# Patient Record
Sex: Male | Born: 1990 | Race: Black or African American | Hispanic: No | Marital: Single | Smoking: Never smoker
Health system: Southern US, Community
[De-identification: ages and names within clinical notes are randomized; demographics above are authoritative.]

## PROBLEM LIST (undated history)

## (undated) HISTORY — PX: WRIST FUSION: SHX839

## (undated) HISTORY — PX: FEMUR SURGERY: SHX943

---
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
## 2009-04-12 ENCOUNTER — Emergency Department (HOSPITAL_BASED_OUTPATIENT_CLINIC_OR_DEPARTMENT_OTHER): Admission: EM | Admit: 2009-04-12 | Discharge: 2009-04-12 | Payer: Self-pay | Admitting: Emergency Medicine

## 2009-04-12 ENCOUNTER — Ambulatory Visit: Payer: Self-pay | Admitting: Diagnostic Radiology

## 2011-02-09 LAB — RAPID STREP SCREEN (MED CTR MEBANE ONLY): Streptococcus, Group A Screen (Direct): NEGATIVE

## 2011-02-09 LAB — URINALYSIS, ROUTINE W REFLEX MICROSCOPIC
Glucose, UA: NEGATIVE mg/dL
Leukocytes, UA: NEGATIVE
Nitrite: NEGATIVE
Protein, ur: 30 mg/dL — AB
Urobilinogen, UA: 1 mg/dL (ref 0.0–1.0)

## 2011-02-09 LAB — URINE MICROSCOPIC-ADD ON

## 2014-07-10 ENCOUNTER — Emergency Department (HOSPITAL_BASED_OUTPATIENT_CLINIC_OR_DEPARTMENT_OTHER)
Admission: EM | Admit: 2014-07-10 | Discharge: 2014-07-10 | Disposition: A | Discharge status: Home / Self Care | Payer: 59 | Attending: Emergency Medicine | Admitting: Emergency Medicine

## 2014-07-10 ENCOUNTER — Encounter (HOSPITAL_BASED_OUTPATIENT_CLINIC_OR_DEPARTMENT_OTHER): Payer: Self-pay | Admitting: Emergency Medicine

## 2014-07-10 DIAGNOSIS — R369 Urethral discharge, unspecified: Secondary | ICD-10-CM | POA: Diagnosis not present

## 2014-07-10 DIAGNOSIS — Z202 Contact with and (suspected) exposure to infections with a predominantly sexual mode of transmission: Secondary | ICD-10-CM | POA: Diagnosis not present

## 2014-07-10 LAB — RPR

## 2014-07-10 LAB — HIV ANTIBODY (ROUTINE TESTING W REFLEX): HIV 1&2 Ab, 4th Generation: NONREACTIVE

## 2014-07-10 MED ORDER — AZITHROMYCIN 250 MG PO TABS
1000.0000 mg | ORAL_TABLET | Freq: Once | ORAL | Status: AC
  Administered 2014-07-10: 1000 mg via ORAL
  Filled 2014-07-10: qty 4

## 2014-07-10 MED ORDER — CEFTRIAXONE SODIUM 250 MG IJ SOLR
250.0000 mg | Freq: Once | INTRAMUSCULAR | Status: AC
  Administered 2014-07-10: 250 mg via INTRAMUSCULAR
  Filled 2014-07-10: qty 250

## 2014-07-10 NOTE — Discharge Instructions (Signed)
Chlamydia Chlamydia is an infection. It is spread through sexual contact. Chlamydia can be in different areas of the body. These areas include the urethra, throat, or rectum. It is important to treat chlamydia as soon as possible. It can damage other organs.  CAUSES  Chlamydia is caused by bacteria. It is a sexually transmitted disease. This means that it is passed from an infected partner during intimate contact. This contact could be with the genitals, mouth, or rectal area.  SIGNS AND SYMPTOMS  There may not be any symptoms. This is often the case early in the infection. If there are symptoms, they are usually mild and may only be noticeable in the morning. Symptoms you may notice include:   Burning with urination.  Pain or swelling in the testicles.  Watery mucus-like discharge from the penis.  Long-standing (chronic) pelvic pain after frequent infections.  Pain, swelling, or itching around the anus.  A sore throat.  Itching, burning, or redness in the eyes, or discharge from the eyes. DIAGNOSIS  To diagnose this infection, your health care provider will do a pelvic exam. A sample of urine or a swab from the rectum may be taken for testing.  TREATMENT  Chlamydia is treated with antibiotic medicines.  HOME CARE INSTRUCTIONS  Take your antibiotic medicine as directed by your health care provider. Finish the antibiotic even if you start to feel better. Incomplete treatment will put you at risk for not being able to have children (sterility).   Take medicines only as directed by your health care provider.   Rest.   Inform any sexual partners about your infection. Even if they are symptom free or have a negative culture or evaluation, they should be treated for the condition.   Do not have sex (intercourse) until treatment is completed and your health care provider says it is okay.   Keep all follow-up visits as directed by your health care provider.   Not all test results  are available during your visit. If your test results are not back during the visit, make an appointment with your health care provider to find out the results. Do not assume everything is normal if you have not heard from your health care provider or the medical facility. It is your responsibility to get your test results. SEEK MEDICAL CARE IF:  You develop new joint pain.  You have a fever. SEEK IMMEDIATE MEDICAL CARE IF:   Your pain increases.   You have abnormal discharge.   You have pain during intercourse. MAKE SURE YOU:   Understand these instructions.  Will watch your condition.  Will get help right away if you are not doing well or get worse. Document Released: 10/19/2005 Document Revised: 03/05/2014 Document Reviewed: 04/27/2013 [facility] Patient Information 2015 [place], [place]. This information is not intended to replace advice given to you by your health care provider. Make sure you discuss any questions you have with your health care provider. Gonorrhea Gonorrhea is an infection that can cause serious problems. If left untreated, the infection may:   Damage the male or male organs.   Cause women to be unable to have children (sterility).   Harm a fetus if the infected woman is pregnant.  It is important to get treatment for gonorrhea as soon as possible. It is also necessary that all your sexual partners be tested for the infection.  CAUSES  Gonorrhea is caused by bacteria called Neisseria gonorrhoeae. The infection is spread from person to person, usually by sexual  contact (such as by anal, vaginal, or oral means). A newborn can contract the infection from his or her mother during birth.  SYMPTOMS  Some people with gonorrhea do not have symptoms. Symptoms may be different in females and males.  Females The most common symptoms are:   Pain in the lower abdomen.   Fever with or without chills.  Other symptoms include:   Abnormal vaginal discharge.    Painful intercourse.   Burning or itching of the vagina or lips of the vagina.   Abnormal vaginal bleeding.   Pain when urinating.   Long-lasting (chronic) pain in the lower abdomen, especially during menstruation or intercourse.   Inability to become pregnant.   Going into premature labor.   Irritation, pain, bleeding, or discharge from the rectum. This may occur if the infection was spread by anal sex.   Sore throat or swollen lymph nodes in the neck. This may occur if the infection was spread by oral sex.  Males The most common symptoms are:   Discharge from the penis.   Pain or burning during urination.   Pain or swelling in the testicles. Other symptoms may include:   Irritation, pain, bleeding, or discharge from the rectum. This may occur if the infection was spread by anal sex.   Sore throat, fever, or swollen lymph nodes in the neck. This may occur if the infection was spread by oral sex.  DIAGNOSIS  A diagnosis is made after a physical exam is done and a sample of discharge is examined under a microscope for the presence of the bacteria. The discharge may be taken from the urethra, cervix, throat, or rectum.  TREATMENT  Gonorrhea is treated with antibiotic medicines. It is important for treatment to begin as soon as possible. Early treatment may prevent some problems from developing.  HOME CARE INSTRUCTIONS   Take medicines only as directed by your health care provider.   Take your antibiotic medicine as directed by your health care provider. Finish the antibiotic even if you start to feel better. Incomplete treatment will put you at risk for continued infection.   Do not have sex until treatment is complete or as directed by your health care provider.   Keep all follow-up visits as directed by your health care provider.   Not all test results are available during your visit. If your test results are not back during the visit, make an  appointment with your health care provider to find out the results. Do not assume everything is normal if you have not heard from your health care provider or the medical facility. It is your responsibility to get your test results.  If you test positive for gonorrhea, inform your recent sexual partners. They need to be checked for gonorrhea even if they do not have symptoms. They may need treatment, even if they test negative for gonorrhea.  SEEK MEDICAL CARE IF:   You develop any bad reaction to the medicine you were prescribed. This may include:   A rash.   Nausea.   Vomiting.   Diarrhea.   Your symptoms do not improve after a few days of taking antibiotics.   Your symptoms get worse.   You develop increased pain, such as in the testicles (for males) or in the abdomen (for females).  You have a fever. MAKE SURE YOU:   Understand these instructions.  Will watch your condition.  Will get help right away if you are not doing well or get worse.  Document Released: 10/16/2000 Document Revised: 03/05/2014 Document Reviewed: 04/26/2013 [facility] Patient Information 2015 [place], [place]. This information is not intended to replace advice given to you by your health care provider. Make sure you discuss any questions you have with your health care provider.   Emergency Department Resource Guide 1) Find a Doctor and Pay Out of Pocket Although you won't have to find out who is covered by your insurance plan, it is a good idea to ask around and get recommendations. You will then need to call the office and see if the doctor you have chosen will accept you as a new patient and what types of options they offer for patients who are self-pay. Some doctors offer discounts or will set up payment plans for their patients who do not have insurance, but you will need to ask so you aren't surprised when you get to your appointment.  2) Contact Your Local Health Department Not all health  departments have doctors that can see patients for sick visits, but many do, so it is worth a call to see if yours does. If you don't know where your local health department is, you can check in your phone book. The CDC also has a tool to help you locate your state's health department, and many state websites also have listings of all of their local health departments.  3) Find a Walk-in Clinic If your illness is not likely to be very severe or complicated, you may want to try a walk in clinic. These are popping up all over the country in pharmacies, drugstores, and shopping centers. They're usually staffed by nurse practitioners or physician assistants that have been trained to treat common illnesses and complaints. They're usually fairly quick and inexpensive. However, if you have serious medical issues or chronic medical problems, these are probably not your best option.  No Primary Care Doctor: - Call Health Connect at  [phone] - they can help you locate a primary care doctor that  accepts your insurance, provides certain services, etc. - Physician Referral Service- [phone]  Chronic Pain Problems: Organization         Address  Phone   Notes  [facility]  [phone] Patients need to be referred by their primary care doctor.   Medication Assistance: Organization         Address  Phone   Notes  [facility] Medication [facility] [address] [place] [place], [place] [address] [phone] --Must be a resident of [facility] -- Must have NO insurance coverage whatsoever (no Medicaid/ Medicare, etc.) -- The pt. MUST have a primary care doctor that directs their care regularly and follows them in the community   [facility]ist  [phone]   [employer]  [phone]    Agencies that provide inexpensive medical care: Organization         Address  Phone   Notes  [facility]  [phone]   [facility]     [phone]   [facility] [address] [place], [place] [address] [phone]   [facility] [address] [place] [place] [phone]   Planned Parenthood    [phone]   [facility]    [phone]   [facility] and [facility]  [address], [place] Phone:  [phone], Fax:  [phone] Hours of Operation:  9 am - 6 pm, M-F.  Also accepts Medicaid/Medicare and self-pay.  [facility]  [address], [place] Phone: [phone]) [phone], Fax: [phone](336) [phone]. Hours of Operation:  8:30 am - 5:30 pm, M-F.  Also accepts Medicaid and self-pay.  [facility] [place] [address], [place] Phone: [phone](336) [phone]   [facility] [address], [place], [place] [phone], Ext. [phone] Mondays & Thursdays: 7-9 AM.  First 15 patients are seen on a first come, first serve basis.    Medicaid-accepting [facility] Providers:  Organization         Address  Phone   Notes  [facility] [address], [place] [phone]) [phone] Also accepts self-pay patients.  [facility] [address] [place], [place]  [phone](336) [phone]   [facility] [address], [place] [phone]) [phone]   [facility] [address] [place] [address], [place] [phone]) [phone]   Renaye RakersVeita Bland [address], [place]   [phone] Only accepts [place] Access [place] patients after they have their name applied to their card.   Self-Pay (no insurance) in [facility] [place] [facility]:  Organization         Address  Phone   Notes  Sickle Cell Patients, [facility] [address] [place] [address], [place] [phone]) [phone]   [facility] [address], [place] [phone]) [phone]   [facility] [place]  [address], [place] [phone]    [facility]/Dr. Osei-Bonsu  [address] [place] [address], [place] or [phone], [place] [phone](336) [phone] Phone number for both [place] and [place] locations is the same.  [facility] [address], [place] [phone](336) [phone]   [facility] [address] [place] [address], [place] or [address] [phone]   [facility] [address], [place] [phone](336) [phone], phone; [phone], fax Sees patients 1st and 3rd Saturday of every month.  Must not qualify for public or private insurance (i.e. Medicaid, Medicare, [place] Health Choice, Veterans' Benefits)  Household income should be no more than 200% of the poverty level The clinic cannot treat you if you are pregnant or think you are pregnant  Sexually transmitted diseases are not treated at the clinic.    Dental Care: Organization         Address  Phone  Notes  [facility] of [facility] [address], [place] [phone] Accepts children up to age 24 who are enrolled in [place] or [place] Health Choice; pregnant women with a Medicaid card; and children who have applied for Medicaid or [place] Health Choice, but were declined, whose parents can pay a reduced fee at time of service.  [facility] Department of [facility] [place]  [address], [place] [phone]) [phone] Accepts children up to age 24 who are enrolled in [place] or [place] Health Choice; pregnant women with a Medicaid card; and children who have applied for Medicaid or [place] Health Choice, but were declined, whose parents can pay a reduced fee at time of service.  [facility]  [address], [place] [phone] Patients are seen by appointment only. Walk-ins are not accepted. [facility] will see patients 24 years of age and older. Monday - Tuesday (8am-5pm)  Most Wednesdays (8:30-5pm) $30 per visit,  cash only  [facility] [employer] PROGRAM  [address], [facility] [phone] Patients are seen by appointment only. Walk-ins are not accepted. [facility] will see patients 67 years of age and older. One Wednesday Evening (Monthly: Volunteer Based).  $30 per visit, cash only  [employer]  [phone] for adults; Children under age 62, call Graduate Pediatric Dentistry at [phone]. Children aged 13-14, please call [phone] to request a pediatric application.  Dental services are provided in all areas of dental care including fillings, crowns and bridges, complete and partial dentures, implants, gum treatment, root canals, and extractions. Preventive care is also provided. Treatment is provided to both adults and children. Patients are selected via a lottery and there is often a waiting list.   [facility] [address] [place]  [phone] [email or web address]   [facility] [address] [place], [place] [phone], Ext. [phone] Second and Fourth Thursday of each month, opens at 6:30 AM; Clinic ends at 9 AM.  Patients are seen on a first-come first-served basis, and a limited number are seen during each clinic.   [facility]  [address] [place], [place] [phone]   Eligibility Requirements You must have lived in [place], [place], or [place] counties for at least the last three months.   You cannot be eligible for state or federal sponsored [place], including [employer], [place], or [employer].   You generally cannot be eligible for healthcare insurance through your employer.    How to apply: Eligibility screenings are held every Tuesday and Wednesday afternoon from 1:00 pm until 4:00 pm. You do not need an appointment for the interview!  [facility] [address] [place], [place] [phone]   [facility] Health Department   [phone]   [facility] Health Department  [phone]   [facility] Health Department  [phone]    Behavioral Health Resources in the Community: Intensive Outpatient Programs Organization         Address  Phone  Notes  [facility] [address] [place], [place] [phone]   [facility] Outpatient [address] [place], [place] [phone]   [facility] [address] [place], [place]  [phone]   [facility] Mental Health [address],  [place], [place] [phone] or [phone]   Substance Abuse Resources Organization         Address  Phone  Notes  [facility] Drug [facility]  870-590-5645   [facility]  [phone]   [facility] [place]  [phone]   Floydene Flock  [phone]   Residential & Outpatient Substance Abuse Program  272-360-0024   Psychological Services Organization         Address  Phone  Notes  [facility]  [phone]   [facility] Services  [phone]   [facility] Mental Health [address] [place] [address], [place] [phone] or [phone]    Mobile Crisis Teams Organization         Address  Phone  Notes  [facility], Mobile Crisis Care Unit  [phone]   [facility]  [address] [place] [address] [place], [place] [phone]   Doristine Locks [address] [place] [phone]    Self-Help/Support Groups Organization         Address  Phone  Notes  [facility] of [place] - variety of support groups  336- I7437963 Call for more information  Narcotics Anonymous (NA), [facility] [address], [employer] [place]  2 meetings at this location   Statistician         Address  Phone  Notes  [facility] [address],    [place]  [phone]   [facility]  [address] [place] [address], [place], [place] [phone]    [facility] [address] [place], [place] [phone] Admissions: 8am-3pm M-F  [facility] [address]    [place], [place] [phone]   [facility] [address] [place], [place], [place] [phone]   [facility] [address],  [place], [place] [phone]   [facility] - Intensive Outpatient [address], [place], [place] [phone]   [facility] ([facility]iction [facility] [address]  [place], [place] [phone] or [phone]   [facility] ([facility]) [address], [place], [place] [phone] Accepts Medicaid  [facility] [place] [address],  [place] [phone] Substance Abuse/Addiction Treatment   [facility] Organization         Address  Phone  Notes  [facility]  269-788-2536   Angie Fava, PhD [address] [place], [place]   [phone]) [phone] or [phone]   [facility] Behavioral   [address] [place], [place] [phone]   [facility] [address] [place], [place] [phone] Insurance/Medicaid/sponsorship through [facility] and Families [address] [place] [address]                                    [place], [place] [phone] Therapy/tele-psych/case  [facility] [address], [place] [phone]    Dr. Lolly Mustache  [phone]   [facility]  [employer] [facility] Dept. 1) [address], [place] 2) [address], [place] 3)  [address], [place] [phone]  [phone]   [facility] Child Abuse Hotline (430)125-7182 or [phone] (After Hours)

## 2014-07-10 NOTE — ED Provider Notes (Signed)
CSN: [id number]     Arrival date & time 07/10/14  1759 History  This chart was scribed for Mirian Mo, MD by Roxy Cedar, ED Scribe. This patient was seen in room MH03/MH03 and the patient's care was started at 6:19 PM.  Chief Complaint  Patient presents with  . Exposure to STD   Patient is a 23 y.o. male presenting with STD exposure. The history is provided by the patient. No language interpreter was used.  Exposure to STD This is a new problem. Pertinent negatives include no chest pain, no abdominal pain, no headaches and no shortness of breath. Nothing aggravates the symptoms. Nothing relieves the symptoms. He has tried nothing for the symptoms.   HPI Comments: Jon Kelley is a 23 y.o. male who presents to the Emergency Department complaining of STD symptoms, due to engaging in unprotected sex last week. Patient states that he was shaking. Patient states he noticed "clear moisture" but no drainage. He states that his girlfriend told him that she had chlamydia. Patient denies associated testicular pain.  History reviewed. No pertinent past medical history. Past Surgical History  Procedure Laterality Date  . Femur surgery    . Wrist fusion     History reviewed. No pertinent family history. History  Substance Use Topics  . Smoking status: Never Smoker   . Smokeless tobacco: Not on file  . Alcohol Use: No    Review of Systems  Constitutional: Negative for fever and chills.  Eyes: Negative for visual disturbance.  Respiratory: Negative for shortness of breath.   Cardiovascular: Negative for chest pain.  Gastrointestinal: Negative for nausea, vomiting and abdominal pain.  Neurological: Negative for headaches.  All other systems reviewed and are negative.  Allergies  Review of patient's allergies indicates no known allergies.  Home Medications   Prior to Admission medications   Not on File   Triage Vitals: BP 121/97  Pulse 77  Temp(Src) 98.2 F (36.8 C)  Resp  16  Ht  (1.753 m)  Wt 175 lb (79.379 kg)  BMI 25.83 kg/m2  SpO2 100%  Physical Exam  Nursing note and vitals reviewed. Constitutional: He appears well-developed and well-nourished.  HENT:  Head: Normocephalic and atraumatic.  Eyes: Conjunctivae are normal. Right eye exhibits no discharge. Left eye exhibits no discharge.  Pulmonary/Chest: Effort normal. No respiratory distress.  Genitourinary: Right testis shows no tenderness. Cremasteric reflex is not absent on the right side. Left testis shows no tenderness. Cremasteric reflex is not absent on the left side. No penile tenderness. Discharge (dry discharge at the urethral meatus.) found.  Lymphadenopathy:       Right: No inguinal adenopathy present.       Left: No inguinal adenopathy present.  Neurological: He is alert. Coordination normal.  Skin: Skin is warm and dry. No rash noted. He is not diaphoretic. No erythema.  Psychiatric: He has a normal mood and affect.   ED Course  Procedures (including critical care time)  DIAGNOSTIC STUDIES: Oxygen Saturation is 100% on RA, normal by my interpretation.    COORDINATION OF CARE: 6:22 PM- Discussed plan to order diagnostic lab work. Pt advised of plan for treatment and pt agrees.  Labs Review Labs Reviewed  RPR  HIV ANTIBODY (ROUTINE TESTING)   Imaging Review No results found.   EKG Interpretation None     MDM   Final diagnoses:  Exposure to sexually transmitted disease (STD)    23 y.o. male  without pertinent PMH presents after exposure to  chlamydia.  Minimal symptoms.  Empirically treated with rocephin/azithro.  No signs of prostatitis or acute testicular etiology.  Given standard return precautions, voiced understanding, and agreed to fu.    Labs and imaging as above reviewed.   1. Exposure to sexually transmitted disease (STD)       I personally performed the services described in this documentation, which was scribed in my presence. The recorded information  has been reviewed and is accurate.     Mirian Mo, MD 07/12/14 1325

## 2014-07-10 NOTE — ED Notes (Signed)
Pt states exposed to STD.

## 2015-02-09 ENCOUNTER — Encounter (HOSPITAL_BASED_OUTPATIENT_CLINIC_OR_DEPARTMENT_OTHER): Payer: Self-pay

## 2015-02-09 ENCOUNTER — Emergency Department (HOSPITAL_BASED_OUTPATIENT_CLINIC_OR_DEPARTMENT_OTHER)
Admission: EM | Admit: 2015-02-09 | Discharge: 2015-02-09 | Disposition: A | Discharge status: Home / Self Care | Payer: 59 | Attending: Emergency Medicine | Admitting: Emergency Medicine

## 2015-02-09 DIAGNOSIS — Z202 Contact with and (suspected) exposure to infections with a predominantly sexual mode of transmission: Secondary | ICD-10-CM | POA: Diagnosis present

## 2015-02-09 LAB — URINALYSIS, ROUTINE W REFLEX MICROSCOPIC
BILIRUBIN URINE: NEGATIVE
GLUCOSE, UA: NEGATIVE mg/dL
Hgb urine dipstick: NEGATIVE
Ketones, ur: NEGATIVE mg/dL
LEUKOCYTES UA: NEGATIVE
Nitrite: NEGATIVE
PROTEIN: NEGATIVE mg/dL
Specific Gravity, Urine: 1.016 (ref 1.005–1.030)
Urobilinogen, UA: 0.2 mg/dL (ref 0.0–1.0)
pH: 6.5 (ref 5.0–8.0)

## 2015-02-09 MED ORDER — AZITHROMYCIN 250 MG PO TABS
1000.0000 mg | ORAL_TABLET | Freq: Once | ORAL | Status: AC
  Administered 2015-02-09: 1000 mg via ORAL
  Filled 2015-02-09: qty 4

## 2015-02-09 MED ORDER — CEFTRIAXONE SODIUM 250 MG IJ SOLR
250.0000 mg | Freq: Once | INTRAMUSCULAR | Status: AC
  Administered 2015-02-09: 250 mg via INTRAMUSCULAR
  Filled 2015-02-09: qty 250

## 2015-02-09 NOTE — ED Notes (Signed)
Pt reports possible exposure to STD, denies any symptoms.

## 2015-02-09 NOTE — Discharge Instructions (Signed)
You were treated today for both gonorrhea and chlamydia. If these tests result positive, you will be contacted and are then obligated to inform your partner for treatment. °Sexually Transmitted Disease °A sexually transmitted disease (STD) is a disease or infection that may be passed (transmitted) from person to person, usually during sexual activity. This may happen by way of saliva, semen, blood, vaginal mucus, or urine. Common STDs include:  °· Gonorrhea.   °· Chlamydia.   °· Syphilis.   °· HIV and AIDS.   °· Genital herpes.   °· Hepatitis B and C.   °· Trichomonas.   °· Human papillomavirus (HPV).   °· Pubic lice.   °· Scabies. °· Mites. °· Bacterial vaginosis. °WHAT ARE CAUSES OF STDs? °An STD may be caused by bacteria, a virus, or parasites. STDs are often transmitted during sexual activity if one person is infected. However, they may also be transmitted through nonsexual means. STDs may be transmitted after:  °· Sexual intercourse with an infected person.   °· Sharing sex toys with an infected person.   °· Sharing needles with an infected person or using unclean piercing or tattoo needles. °· Having intimate contact with the genitals, mouth, or rectal areas of an infected person.   °· Exposure to infected fluids during birth. °WHAT ARE THE SIGNS AND SYMPTOMS OF STDs? °Different STDs have different symptoms. Some people may not have any symptoms. If symptoms are present, they may include:  °· Painful or bloody urination.   °· Pain in the pelvis, abdomen, vagina, anus, throat, or eyes.   °· A skin rash, itching, or irritation. °· Growths, ulcerations, blisters, or sores in the genital and anal areas. °· Abnormal vaginal discharge with or without bad odor.   °· Penile discharge in men.   °· Fever.   °· Pain or bleeding during sexual intercourse.   °· Swollen glands in the groin area.   °· Yellow skin and eyes (jaundice). This is seen with hepatitis.   °· Swollen testicles. °· Infertility. °· Sores and blisters  in the mouth. °HOW ARE STDs DIAGNOSED? °To make a diagnosis, your health care provider may:  °· Take a medical history.   °· Perform a physical exam.   °· Take a sample of any discharge to examine. °· Swab the throat, cervix, opening to the penis, rectum, or vagina for testing. °· Test a sample of your first morning urine.   °· Perform blood tests.   °· Perform a Pap test, if this applies.   °· Perform a colposcopy.   °· Perform a laparoscopy.   °HOW ARE STDs TREATED? ° Treatment depends on the STD. Some STDs may be treated but not cured.  °· Chlamydia, gonorrhea, trichomonas, and syphilis can be cured with antibiotic medicine.   °· Genital herpes, hepatitis, and HIV can be treated, but not cured, with prescribed medicines. The medicines lessen symptoms.   °· Genital warts from HPV can be treated with medicine or by freezing, burning (electrocautery), or surgery. Warts may come back.   °· HPV cannot be cured with medicine or surgery. However, abnormal areas may be removed from the cervix, vagina, or vulva.   °· If your diagnosis is confirmed, your recent sexual partners need treatment. This is true even if they are symptom-free or have a negative culture or evaluation. They should not have sex until their health care providers say it is okay. °HOW CAN I REDUCE MY RISK OF GETTING AN STD? °Take these steps to reduce your risk of getting an STD: °· Use latex condoms, dental dams, and water-soluble lubricants during sexual activity. Do not use petroleum jelly or oils. °· Avoid having multiple sex   sex partners.  Do not have sex with someone who has other sex partners.  Do not have sex with anyone you do not know or who is at high risk for an STD.  Avoid risky sex practices that can break your skin.  Do not have sex if you have open sores on your mouth or skin.  Avoid drinking too much alcohol or taking illegal drugs. Alcohol and drugs can affect your judgment and put you in a vulnerable position.  Avoid engaging  in oral and anal sex acts.  Get vaccinated for HPV and hepatitis. If you have not received these vaccines in the past, talk to your health care provider about whether one or both might be right for you.   If you are at risk of being infected with HIV, it is recommended that you take a prescription medicine daily to prevent HIV infection. This is called pre-exposure prophylaxis (PrEP). You are considered at risk if:  You are a man who has sex with other men (MSM).  You are a heterosexual man or woman and are sexually active with more than one partner.  You take drugs by injection.  You are sexually active with a partner who has HIV.  Talk with your health care provider about whether you are at high risk of being infected with HIV. If you choose to begin PrEP, you should first be tested for HIV. You should then be tested every 3 months for as long as you are taking PrEP.  WHAT SHOULD I DO IF I THINK I HAVE AN STD?  See your health care provider.   Tell your sexual partner(s). They should be tested and treated for any STDs.  Do not have sex until your health care provider says it is okay. WHEN SHOULD I GET IMMEDIATE MEDICAL CARE? Contact your health care provider right away if:   You have severe abdominal pain.  You are a man and notice swelling or pain in your testicles.  You are a woman and notice swelling or pain in your vagina. Document Released: 01/09/2003 Document Revised: 10/24/2013 Document Reviewed: 05/09/2013 [facility] Patient Information 2015 [place], [place]. This information is not intended to replace advice given to you by your health care provider. Make sure you discuss any questions you have with your health care provider.  Safe Sex Safe sex is about reducing the risk of giving or getting a sexually transmitted disease (STD). STDs are spread through sexual contact involving the genitals, mouth, or rectum. Some STDs can be cured and others cannot. Safe sex can also  prevent unintended pregnancies.  WHAT ARE SOME SAFE SEX PRACTICES?  Limit your sexual activity to only one partner who is having sex with only you.  Talk to your partner about his or her past partners, past STDs, and drug use.  Use a condom every time you have sexual intercourse. This includes vaginal, oral, and anal sexual activity. Both females and males should wear condoms during oral sex. Only use latex or polyurethane condoms and water-based lubricants. Using petroleum-based lubricants or oils to lubricate a condom will weaken the condom and increase the chance that it will break. The condom should be in place from the beginning to the end of sexual activity. Wearing a condom reduces, but does not completely eliminate, your risk of getting or giving an STD. STDs can be spread by contact with infected body fluids and skin.  Get vaccinated for hepatitis B and HPV.  Avoid alcohol and recreational drugs, which  can affect your judgment. You may forget to use a condom or participate in high-risk sex.  For females, avoid douching after sexual intercourse. Douching can spread an infection farther into the reproductive tract.  Check your body for signs of sores, blisters, rashes, or unusual discharge. See your health care provider if you notice any of these signs.  Avoid sexual contact if you have symptoms of an infection or are being treated for an STD. If you or your partner has herpes, avoid sexual contact when blisters are present. Use condoms at all other times.  If you are at risk of being infected with HIV, it is recommended that you take a prescription medicine daily to prevent HIV infection. This is called pre-exposure prophylaxis (PrEP). You are considered at risk if:  You are a man who has sex with other men (MSM).  You are a heterosexual man or woman who is sexually active with more than one partner.  You take drugs by injection.  You are sexually active with a partner who has  HIV.  Talk with your health care provider about whether you are at high risk of being infected with HIV. If you choose to begin PrEP, you should first be tested for HIV. You should then be tested every 3 months for as long as you are taking PrEP.  See your health care provider for regular screenings, exams, and tests for other STDs. Before having sex with a new partner, each of you should be screened for STDs and should talk about the results with each other. WHAT ARE THE BENEFITS OF SAFE SEX?   There is less chance of getting or giving an STD.  You can prevent unwanted or unintended pregnancies.  By discussing safe sex concerns with your partner, you may increase feelings of intimacy, comfort, trust, and honesty between the two of you. Document Released: 11/26/2004 Document Revised: 03/05/2014 Document Reviewed: 04/11/2012 [facility] Patient Information 2015 [place]. This information is not intended to replace advice given to you by your health care provider. Make sure you discuss any questions you have with your health care provider.

## 2015-02-09 NOTE — ED Notes (Signed)
Pt reports was called by one of his active sexual partners and informed they had std, unsure of which per pt.  Denies dysuria or penile discharge.

## 2015-02-09 NOTE — ED Notes (Signed)
Pt ambulating independently w/ steady gait on d/c in no acute distress, A&Ox4.D/c instructions reviewed w/ pt and family - pt and family deny any further questions or concerns at present.  

## 2015-02-09 NOTE — ED Provider Notes (Signed)
CSN: [id number]     Arrival date & time 02/09/15  1357 History   First MD Initiated Contact with Patient 02/09/15 1517     Chief Complaint  Patient presents with  . Exposure to STD     (Consider location/radiation/quality/duration/timing/severity/associated sxs/prior Treatment) HPI Comments: 24 year old male requesting STD check. States he was called by one of his sexual partners and informed that she had Chlamydia. He then realized another one of his sexual partners also had Chlamydia. He is currently sexually active with 3 male partners, and only uses protection on 1. Denies history of sexually transmitted disease. Denies penile discharge, pain, testicular pain or swelling, urinary symptoms, fever or chills. He would also like a check of HIV and syphilis.  Patient is a 24 y.o. male presenting with STD exposure. The history is provided by the patient.  Exposure to STD    History reviewed. No pertinent past medical history. Past Surgical History  Procedure Laterality Date  . Femur surgery    . Wrist fusion     No family history on file. History  Substance Use Topics  . Smoking status: Never Smoker   . Smokeless tobacco: Not on file  . Alcohol Use: No    Review of Systems  10 Systems reviewed and are negative for acute change except as noted in the HPI.  Allergies  Review of patient's allergies indicates no known allergies.  Home Medications   Prior to Admission medications   Not on File   BP 130/79 mmHg  Pulse 88  Temp(Src) 98.3 F (36.8 C) (Oral)  Resp 18  Ht 5\' 7"  (1.702 m)  Wt 173 lb (78.472 kg)  BMI 27.09 kg/m2  SpO2 98% Physical Exam  Constitutional: He is oriented to person, place, and time. He appears well-developed and well-nourished. No distress.  HENT:  Head: Normocephalic and atraumatic.  Eyes: Conjunctivae and EOM are normal.  Neck: Normal range of motion. Neck supple.  Cardiovascular: Normal rate, regular rhythm and normal heart sounds.    Pulmonary/Chest: Effort normal and breath sounds normal.  Genitourinary: Right testis shows no mass, no swelling and no tenderness. Left testis shows no mass, no swelling and no tenderness. Circumcised. No penile erythema or penile tenderness. No discharge found.  Exam chaperoned. Gc/chlamydia swab obtained.  Musculoskeletal: Normal range of motion. He exhibits no edema.  Neurological: He is alert and oriented to person, place, and time.  Skin: Skin is warm and dry.  Psychiatric: He has a normal mood and affect. His behavior is normal.  Nursing note and vitals reviewed.   ED Course  Procedures (including critical care time) Labs Review Labs Reviewed  URINALYSIS, ROUTINE W REFLEX MICROSCOPIC - Abnormal; Notable for the following:    APPearance CLOUDY (*)    All other components within normal limits  RPR  HIV ANTIBODY (ROUTINE TESTING)  GC/CHLAMYDIA PROBE AMP ([facility])    Imaging Review No results found.   EKG Interpretation None      MDM   Final diagnoses:  Exposure to STD   GC/Chlamydia swab pending. HIV/RPR pending. Azithromycin and Rocephin given in the ED. States sexual practices discussed. Stable for discharge. Return precautions given. Patient states understanding of treatment care plan and is agreeable.  Kathrynn SpeedRobyn M Tristen Luce, PA-C 02/09/15 1542  Arby BarretteMarcy Pfeiffer, MD 02/09/15 2157

## 2015-02-10 LAB — HIV ANTIBODY (ROUTINE TESTING W REFLEX): HIV SCREEN 4TH GENERATION: NONREACTIVE

## 2015-02-10 LAB — RPR: RPR Ser Ql: NONREACTIVE

## 2015-02-11 LAB — GC/CHLAMYDIA PROBE AMP (~~LOC~~) NOT AT ARMC
CHLAMYDIA, DNA PROBE: NEGATIVE
Neisseria Gonorrhea: NEGATIVE

## 2015-02-12 ENCOUNTER — Telehealth (HOSPITAL_BASED_OUTPATIENT_CLINIC_OR_DEPARTMENT_OTHER): Payer: Self-pay | Admitting: Emergency Medicine

## 2015-03-07 ENCOUNTER — Emergency Department (HOSPITAL_BASED_OUTPATIENT_CLINIC_OR_DEPARTMENT_OTHER)
Admission: EM | Admit: 2015-03-07 | Discharge: 2015-03-07 | Discharge status: Against Medical Advice | Payer: 59 | Attending: Emergency Medicine | Admitting: Emergency Medicine

## 2015-03-07 ENCOUNTER — Encounter (HOSPITAL_BASED_OUTPATIENT_CLINIC_OR_DEPARTMENT_OTHER): Payer: Self-pay | Admitting: *Deleted

## 2015-03-07 DIAGNOSIS — R7989 Other specified abnormal findings of blood chemistry: Secondary | ICD-10-CM | POA: Diagnosis not present

## 2015-03-07 NOTE — ED Notes (Addendum)
States he wants an HIV test. Unable to get some hx of pt. Pt will not stay off his phone.

## 2015-03-07 NOTE — ED Provider Notes (Signed)
Pt here for HIV check. Here for the same last month. He left without being seen by myself but after triage.  Jon Peliffany Tajah Schreiner, PA-C 03/07/15 2008  Vanetta MuldersScott Zackowski, MD 03/08/15 [phone]

## 2018-04-13 ENCOUNTER — Encounter (HOSPITAL_BASED_OUTPATIENT_CLINIC_OR_DEPARTMENT_OTHER): Payer: Self-pay | Admitting: *Deleted

## 2018-04-13 ENCOUNTER — Emergency Department (HOSPITAL_BASED_OUTPATIENT_CLINIC_OR_DEPARTMENT_OTHER)
Admission: EM | Admit: 2018-04-13 | Discharge: 2018-04-13 | Disposition: A | Discharge status: Home / Self Care | Payer: No Typology Code available for payment source | Attending: Emergency Medicine | Admitting: Emergency Medicine

## 2018-04-13 ENCOUNTER — Other Ambulatory Visit: Payer: Self-pay

## 2018-04-13 DIAGNOSIS — S39012A Strain of muscle, fascia and tendon of lower back, initial encounter: Secondary | ICD-10-CM | POA: Diagnosis not present

## 2018-04-13 DIAGNOSIS — Y9389 Activity, other specified: Secondary | ICD-10-CM | POA: Insufficient documentation

## 2018-04-13 DIAGNOSIS — S199XXA Unspecified injury of neck, initial encounter: Secondary | ICD-10-CM | POA: Diagnosis present

## 2018-04-13 DIAGNOSIS — Y998 Other external cause status: Secondary | ICD-10-CM | POA: Diagnosis not present

## 2018-04-13 DIAGNOSIS — S161XXA Strain of muscle, fascia and tendon at neck level, initial encounter: Secondary | ICD-10-CM | POA: Insufficient documentation

## 2018-04-13 DIAGNOSIS — Y92410 Unspecified street and highway as the place of occurrence of the external cause: Secondary | ICD-10-CM | POA: Diagnosis not present

## 2018-04-13 MED ORDER — METHOCARBAMOL 500 MG PO TABS: 500.0000 mg | ORAL_TABLET | Freq: Four times a day (QID) | ORAL | 0 refills | Status: DC | PRN

## 2018-04-13 MED ORDER — IBUPROFEN 800 MG PO TABS: 800.0000 mg | ORAL_TABLET | Freq: Three times a day (TID) | ORAL | 0 refills | Status: DC | PRN
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
## 2018-04-13 NOTE — ED Notes (Signed)
Pt verbalizes understanding of d/c instructions and denies any further needs at this time. 

## 2018-04-13 NOTE — Discharge Instructions (Signed)

## 2018-04-13 NOTE — ED Notes (Signed)
ED Provider at bedside. 

## 2019-01-07 ENCOUNTER — Encounter (HOSPITAL_BASED_OUTPATIENT_CLINIC_OR_DEPARTMENT_OTHER): Payer: Self-pay | Admitting: *Deleted

## 2019-01-07 ENCOUNTER — Other Ambulatory Visit: Payer: Self-pay

## 2019-01-07 ENCOUNTER — Emergency Department (HOSPITAL_BASED_OUTPATIENT_CLINIC_OR_DEPARTMENT_OTHER): Payer: Self-pay

## 2019-01-07 ENCOUNTER — Emergency Department (HOSPITAL_BASED_OUTPATIENT_CLINIC_OR_DEPARTMENT_OTHER)
Admission: EM | Admit: 2019-01-07 | Discharge: 2019-01-08 | Disposition: A | Discharge status: Home / Self Care | Payer: Self-pay | Attending: Emergency Medicine | Admitting: Emergency Medicine

## 2019-01-07 DIAGNOSIS — Y92410 Unspecified street and highway as the place of occurrence of the external cause: Secondary | ICD-10-CM | POA: Insufficient documentation

## 2019-01-07 DIAGNOSIS — Z23 Encounter for immunization: Secondary | ICD-10-CM | POA: Insufficient documentation

## 2019-01-07 DIAGNOSIS — S80211A Abrasion, right knee, initial encounter: Secondary | ICD-10-CM | POA: Insufficient documentation

## 2019-01-07 DIAGNOSIS — S01511A Laceration without foreign body of lip, initial encounter: Secondary | ICD-10-CM | POA: Insufficient documentation

## 2019-01-07 DIAGNOSIS — Y999 Unspecified external cause status: Secondary | ICD-10-CM | POA: Insufficient documentation

## 2019-01-07 DIAGNOSIS — Y9389 Activity, other specified: Secondary | ICD-10-CM | POA: Insufficient documentation

## 2019-01-07 DIAGNOSIS — M79601 Pain in right arm: Secondary | ICD-10-CM | POA: Insufficient documentation

## 2019-01-07 MED ORDER — TETANUS-DIPHTH-ACELL PERTUSSIS 5-2.5-18.5 LF-MCG/0.5 IM SUSP
0.5000 mL | Freq: Once | INTRAMUSCULAR | Status: AC
  Administered 2019-01-07: 0.5 mL via INTRAMUSCULAR
  Filled 2019-01-07: qty 0.5

## 2019-01-07 MED ORDER — LIDOCAINE HCL (PF) 1 % IJ SOLN
5.0000 mL | Freq: Once | INTRAMUSCULAR | Status: AC
  Administered 2019-01-07: 5 mL
  Filled 2019-01-07: qty 5
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
## 2019-01-07 NOTE — ED Notes (Signed)
Pt has a laceration to the inside of his lower lip on the left side. Bleeding controlled

## 2019-01-07 NOTE — ED Notes (Signed)
Patient transported to CT 
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
## 2019-01-08 MED ORDER — IBUPROFEN 600 MG PO TABS: 600.0000 mg | ORAL_TABLET | Freq: Four times a day (QID) | ORAL | 0 refills | Status: AC | PRN

## 2019-01-08 MED ORDER — AMOXICILLIN-POT CLAVULANATE 875-125 MG PO TABS: 1.0000 | ORAL_TABLET | Freq: Two times a day (BID) | ORAL | 0 refills | Status: AC

## 2019-01-08 NOTE — Discharge Instructions (Addendum)
Apply ice to your lip and take anti-inflammatories as prescribed.  Use ice to help with the lip swelling.  Your sutures should dissolve within 2 weeks.  Follow a soft diet for the next several days.  Return to the ED if any of new or worsening symptoms.
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
## 2022-01-09 ENCOUNTER — Other Ambulatory Visit: Payer: Self-pay

## 2022-01-09 ENCOUNTER — Other Ambulatory Visit (HOSPITAL_BASED_OUTPATIENT_CLINIC_OR_DEPARTMENT_OTHER): Payer: Self-pay

## 2022-01-09 ENCOUNTER — Emergency Department (HOSPITAL_BASED_OUTPATIENT_CLINIC_OR_DEPARTMENT_OTHER)
Admission: EM | Admit: 2022-01-09 | Discharge: 2022-01-09 | Disposition: A | Discharge status: Home / Self Care | Payer: Self-pay | Attending: Emergency Medicine | Admitting: Emergency Medicine

## 2022-01-09 ENCOUNTER — Encounter (HOSPITAL_BASED_OUTPATIENT_CLINIC_OR_DEPARTMENT_OTHER): Payer: Self-pay | Admitting: Emergency Medicine

## 2022-01-09 DIAGNOSIS — X509XXA Other and unspecified overexertion or strenuous movements or postures, initial encounter: Secondary | ICD-10-CM | POA: Insufficient documentation

## 2022-01-09 DIAGNOSIS — Y93H2 Activity, gardening and landscaping: Secondary | ICD-10-CM | POA: Insufficient documentation

## 2022-01-09 DIAGNOSIS — M545 Low back pain, unspecified: Secondary | ICD-10-CM | POA: Insufficient documentation

## 2022-01-09 DIAGNOSIS — Y92007 Garden or yard of unspecified non-institutional (private) residence as the place of occurrence of the external cause: Secondary | ICD-10-CM | POA: Insufficient documentation

## 2022-01-09 MED ORDER — METHOCARBAMOL 500 MG PO TABS
500.0000 mg | ORAL_TABLET | Freq: Three times a day (TID) | ORAL | 0 refills | Status: AC | PRN
  Filled 2022-01-09: qty 8, 3d supply, fill #0

## 2022-01-09 MED ORDER — KETOROLAC TROMETHAMINE 30 MG/ML IJ SOLN
15.0000 mg | Freq: Once | INTRAMUSCULAR | Status: AC
  Administered 2022-01-09: 15 mg via INTRAMUSCULAR
  Filled 2022-01-09: qty 1

## 2022-01-09 MED ORDER — PREDNISONE 20 MG PO TABS
40.0000 mg | ORAL_TABLET | Freq: Every day | ORAL | 0 refills | Status: AC
  Filled 2022-01-09: qty 6, 3d supply, fill #0

## 2022-01-09 NOTE — ED Provider Notes (Signed)
?[facility] EMERGENCY DEPARTMENT ?Provider Note ? ? ?CSN: [id number] ?Arrival date & time: 01/09/22  0930 ? ?  ? ?History ? ?Chief Complaint  ?Patient presents with  ? Back Pain  ? ? ?Rudolf E Zamauri Nez. is a 31 y.o. male. ? ? ?Back Pain ?Associated symptoms: no weakness   ?Patient presents to lower to mid back pain.  Said for around 2 weeks.  Began after doing some yard work.  States he has been doing more work in the yard and has had pain since.  Worse with certain positions.  No loss of bowel bladder control.  No fevers.  No definite trauma.  No IV drug use history.  No dysuria. ?  ? ?Home Medications ?Prior to Admission medications   ?Medication Sig Start Date End Date Taking? Authorizing Provider  ?methocarbamol (ROBAXIN) 500 MG tablet Take 1 tablet (500 mg total) by mouth every 8 (eight) hours as needed for muscle spasms. 01/09/22  Yes Benjiman Core, MD  ?predniSONE (DELTASONE) 20 MG tablet Take 2 tablets (40 mg total) by mouth daily. 01/09/22  Yes Benjiman Core, MD  ?amoxicillin-clavulanate (AUGMENTIN) 875-125 MG tablet Take 1 tablet by mouth every 12 (twelve) hours. 01/08/19   Rancour, Jeannett Senior, MD  ?ibuprofen (ADVIL,MOTRIN) 600 MG tablet Take 1 tablet (600 mg total) by mouth every 6 (six) hours as needed. 01/08/19   Glynn Octave, MD  ?   ? ?Allergies    ?Patient has no known allergies.   ? ?Review of Systems   ?Review of Systems  ?HENT:  Negative for congestion.   ?Respiratory:  Negative for shortness of breath.   ?Musculoskeletal:  Positive for back pain.  ?Neurological:  Negative for weakness.  ? ?Physical Exam ?Updated Vital Signs ?BP 136/86 (BP Location: Right Arm)   Pulse 64   Temp 97.9 ?F (36.6 ?C) (Oral)   Resp 18   Ht 5\' 7"  (1.702 m)   Wt 90.7 kg   SpO2 100%   BMI 31.32 kg/m?  ?Physical Exam ?Vitals reviewed.  ?Cardiovascular:  ?   Rate and Rhythm: Normal rate.  ?Musculoskeletal:  ?   Cervical back: Neck supple.  ?   Comments: Tenderness over upper lumbar lower thoracic spine.   No deformity.  No step-off.  ?Neurological:  ?   Mental Status: He is alert.  ? ? ?ED Results / Procedures / Treatments   ?Labs ?(all labs ordered are listed, but only abnormal results are displayed) ?Labs Reviewed - No data to display ? ?EKG ?None ? ?Radiology ?No results found. ? ?Procedures ?Procedures  ? ? ?Medications Ordered in ED ?Medications  ?ketorolac (TORADOL) 30 MG/ML injection 15 mg (has no administration in time range)  ? ? ?ED Course/ Medical Decision Making/ A&P ?  ?                        ?Medical Decision Making ?Risk ?Prescription drug management. ? ? ?Patient with lower thoracic or upper lumbar back pain.  Began after doing some yard work.  Not having red flags.  No cancer.  No fevers.  No IV drug use history.  No loss of bladder bowel control.  I do not feel as imaging would help at this time.  We will treat symptomatically with muscle laxer's.  Will give steroids.  Doubt fracture with no severe injury.  Appears stable for discharge home with outpatient follow-up as needed. ? ? ? ? ? ? ? ?Final Clinical Impression(s) / ED Diagnoses ?  Final diagnoses:  ?Acute midline low back pain without sciatica  ? ? ?Rx / DC Orders ?ED Discharge Orders   ? ?      Ordered  ?  methocarbamol (ROBAXIN) 500 MG tablet  Every 8 hours PRN       ? 01/09/22 1011  ?  predniSONE (DELTASONE) 20 MG tablet  Daily       ? 01/09/22 1011  ? ?  ?  ? ?  ? ? ?  ?Benjiman Core, MD ?01/09/22 1023 ? ?

## 2022-01-09 NOTE — ED Triage Notes (Signed)
Mid to lower back pain x 2 weeks after doing yard work. Does not have leg pain or b/b problems  ?
